# Patient Record
Sex: Female | Born: 1990 | Race: White | Hispanic: No | Marital: Married | Smoking: Never smoker
Health system: Southern US, Community
[De-identification: ages and names within clinical notes are randomized; demographics above are authoritative.]

## PROBLEM LIST (undated history)

## (undated) DIAGNOSIS — T7840XA Allergy, unspecified, initial encounter: Secondary | ICD-10-CM

## (undated) DIAGNOSIS — F419 Anxiety disorder, unspecified: Secondary | ICD-10-CM

## (undated) HISTORY — DX: Allergy, unspecified, initial encounter: T78.40XA

## (undated) HISTORY — DX: Anxiety disorder, unspecified: F41.9

---
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
## 2009-03-28 ENCOUNTER — Inpatient Hospital Stay (HOSPITAL_COMMUNITY): Admission: AD | Admit: 2009-03-28 | Discharge: 2009-03-28 | Payer: Self-pay | Admitting: Obstetrics and Gynecology

## 2009-03-29 ENCOUNTER — Inpatient Hospital Stay (HOSPITAL_COMMUNITY): Admission: AD | Admit: 2009-03-29 | Discharge: 2009-03-31 | Payer: Self-pay | Admitting: Obstetrics and Gynecology

## 2010-05-05 ENCOUNTER — Ambulatory Visit: Payer: Self-pay | Admitting: Nurse Practitioner

## 2010-05-05 ENCOUNTER — Inpatient Hospital Stay (HOSPITAL_COMMUNITY): Admission: AD | Admit: 2010-05-05 | Discharge: 2010-05-06 | Payer: Self-pay | Admitting: Obstetrics and Gynecology

## 2010-05-06 IMAGING — US US OB TRANSVAGINAL MODIFY
1 series · 13 of 28 positions shown · non-contrast
Comparison: None.

CLINICAL DATA: Vaginal bleeding.

OBSTETRIC <14 WK US AND TRANSVAGINAL OB US
TECHNIQUE: Both transabdominal and transvaginal ultrasound
examinations were performed for complete evaluation of the
gestation as well as the maternal uterus, adnexal regions, and
pelvic cul-de-sac.  Transvaginal technique was performed to assess
early pregnancy.

[Series 1: us ob comp less 14 wks · 0.21mm/px · 13 of 42 slices shown]
[im 2/42]
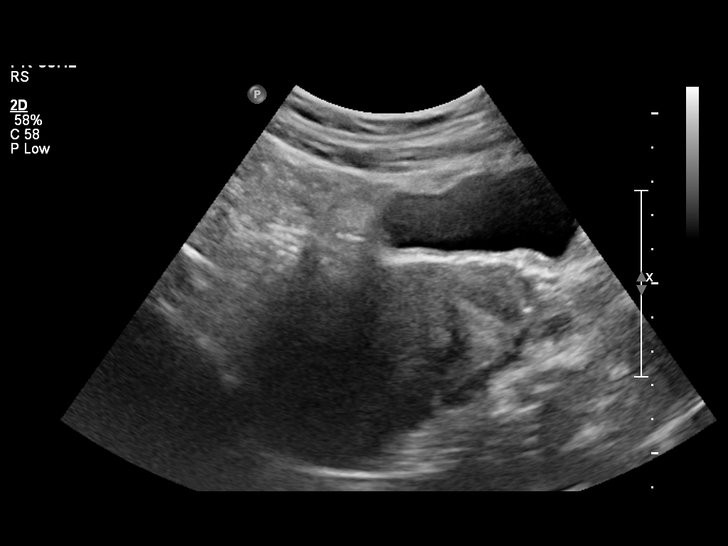
[im 5/42]
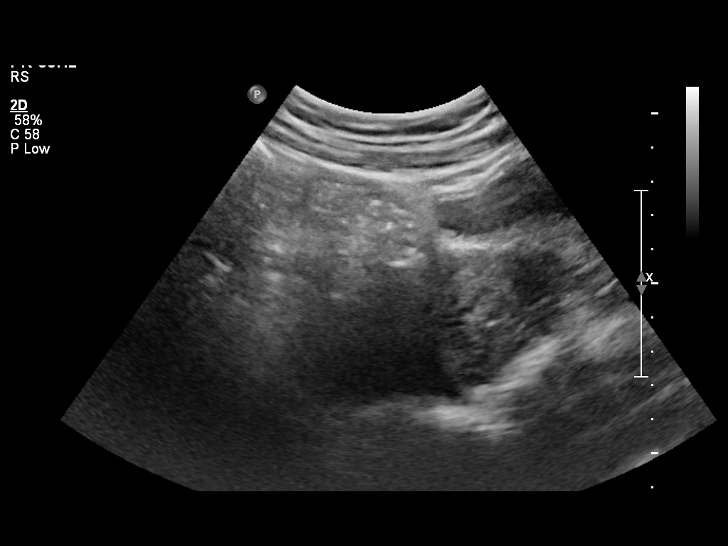
[im 8/42]
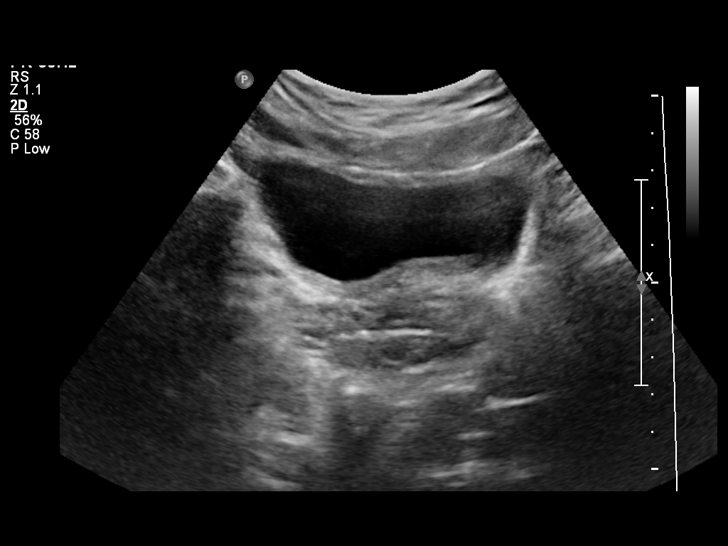
[im 11/42]
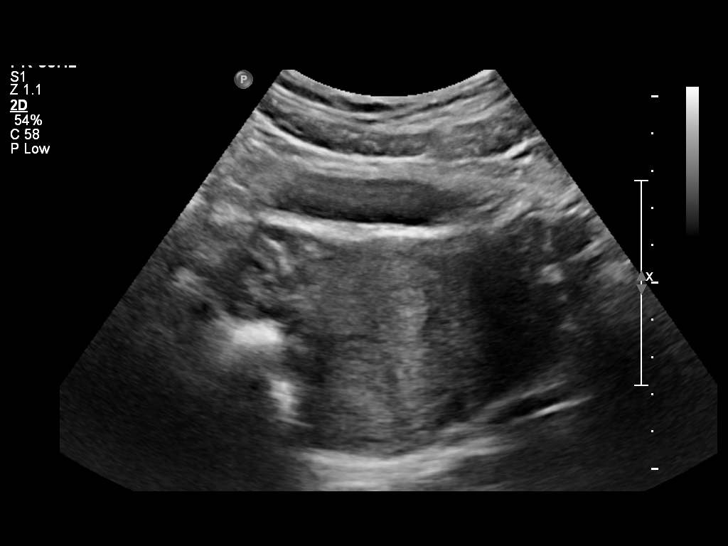
[im 14/42]
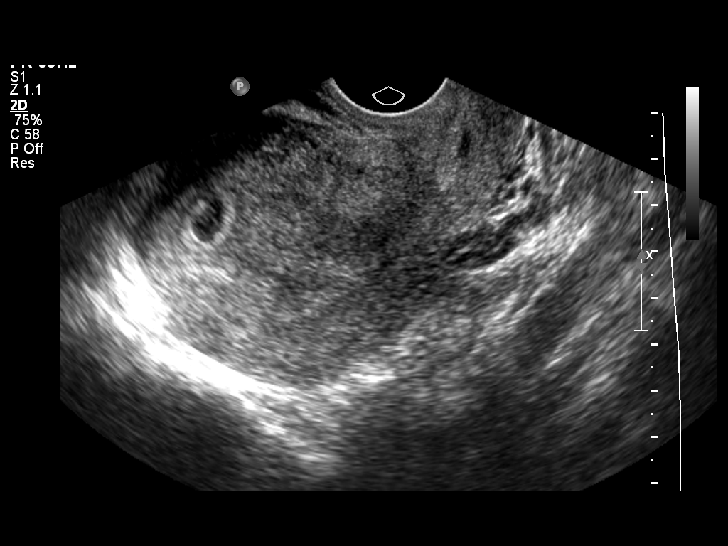
[im 17/42]
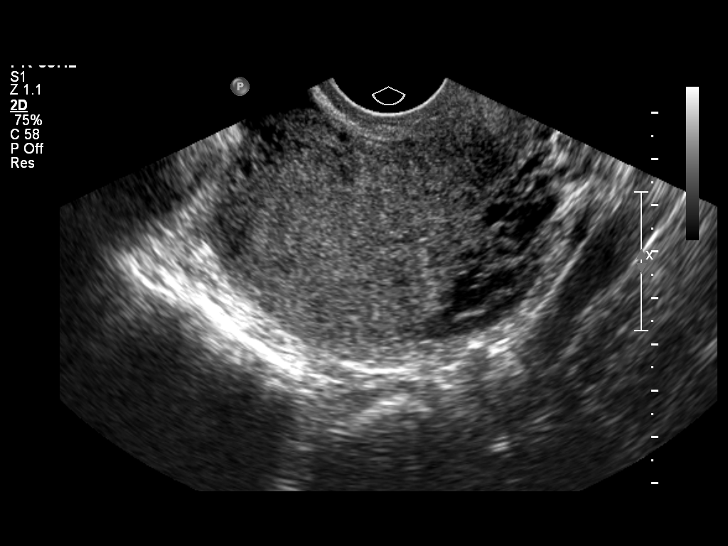
[im 22/42]
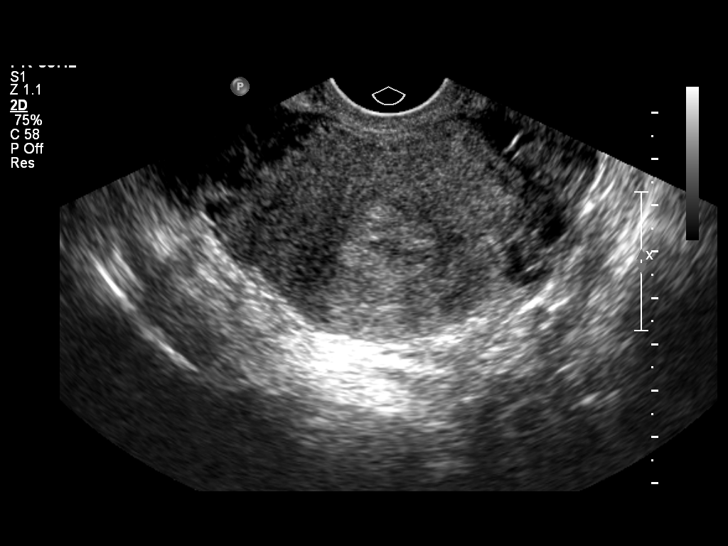
[im 25/42]
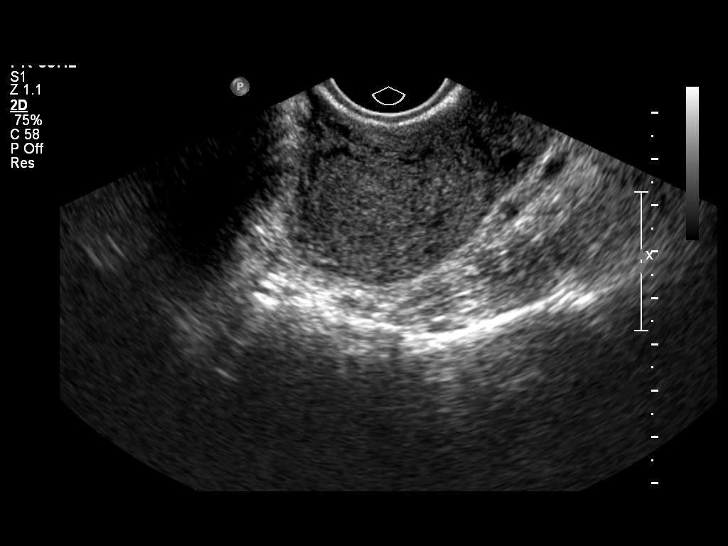
[im 28/42]
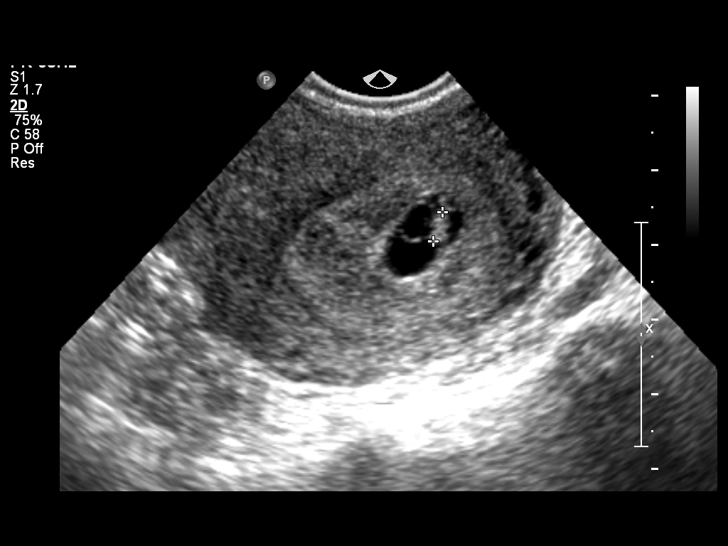
[im 31/42]
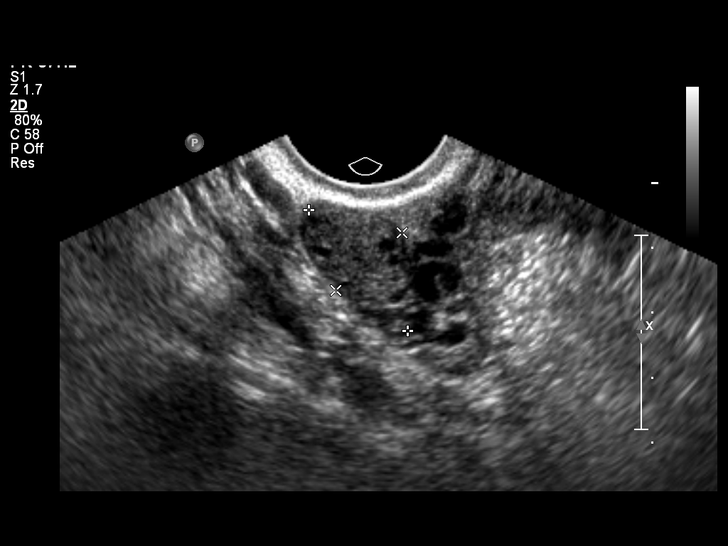
[im 34/42]
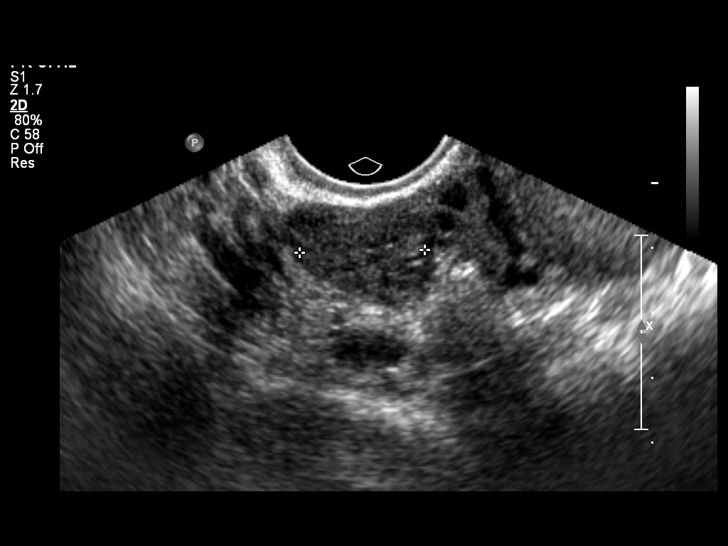
[im 37/42]
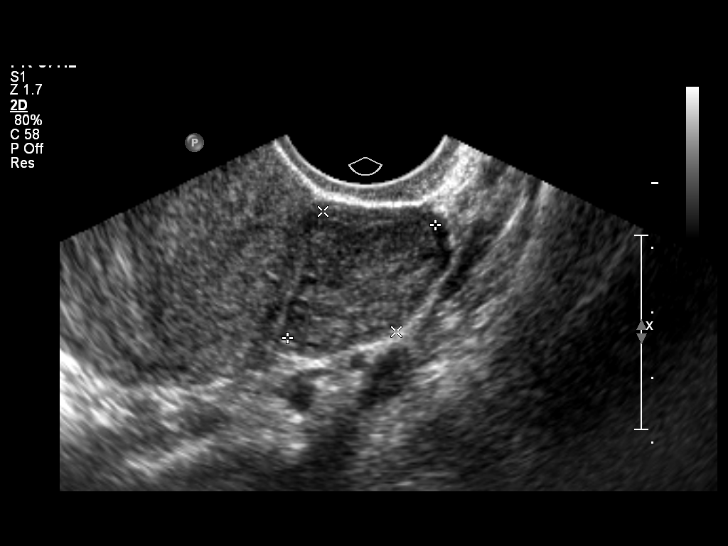
[im 40/42]
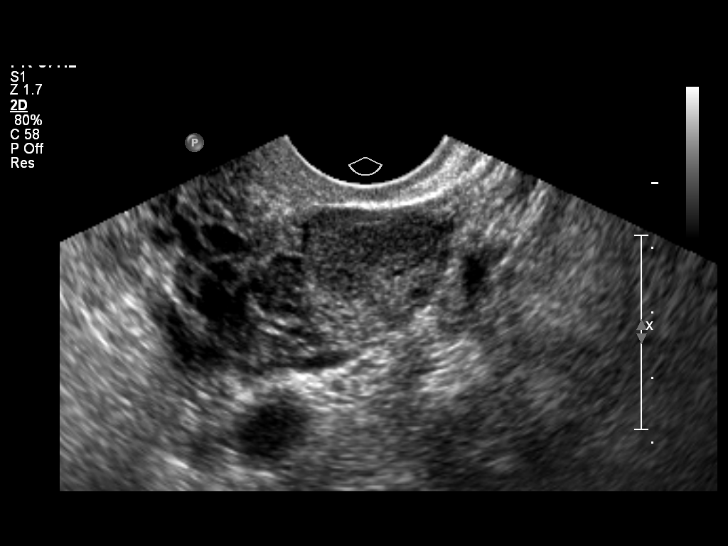

[13 of 28 positions shown; findings below may reference images not displayed]

FINDINGS: A single intrauterine gestational sac is identified.  A yolk sac is
seen.  The embryo has a crown-rump length of 4.2 mm, corresponding
to a gestational age of 6 weeks 1 day.  Cardiac activity is
visualized.  The measured heart rate is 120 beats per minute.

There is no evidence of subchorionic hemorrhage.  The uterus is
unremarkable in appearance.  No fibroids are identified.

The right ovary measures 2.4 x 1.4 x 1.9 cm, while the left ovary
measures 2.9 x 2.2 x 1.8 cm.  No suspicious adnexal masses are
seen.  The ovaries are unremarkable in appearance.  There is no
definite evidence for ovarian torsion.

No free fluid is seen in the pelvic cul-de-sac.
IMPRESSION: Single live intrauterine pregnancy, with a crown-rump length of
mm, corresponding to a gestational age of 6 weeks 1 day.  This does
not match the patient's gestational age by LMP, and reflects a new
estimated date of delivery [DATE].

## 2010-05-07 ENCOUNTER — Inpatient Hospital Stay (HOSPITAL_COMMUNITY): Admission: AD | Admit: 2010-05-07 | Discharge: 2010-05-07 | Payer: Self-pay | Admitting: Obstetrics and Gynecology

## 2010-05-07 ENCOUNTER — Ambulatory Visit: Payer: Self-pay | Admitting: Obstetrics and Gynecology

## 2011-01-17 LAB — CBC
HCT: 37.3 % (ref 36.0–46.0)
Hemoglobin: 13 g/dL (ref 12.0–15.0)
MCH: 31.6 pg (ref 26.0–34.0)
MCHC: 34.9 g/dL (ref 30.0–36.0)
MCV: 90.6 fL (ref 78.0–100.0)
Platelets: 245 10*3/uL (ref 150–400)
RBC: 4.12 MIL/uL (ref 3.87–5.11)
RDW: 13 % (ref 11.5–15.5)
WBC: 7.7 10*3/uL (ref 4.0–10.5)

## 2011-01-17 LAB — HCG, QUANTITATIVE, PREGNANCY: hCG, Beta Chain, Quant, S: 2760 m[IU]/mL — ABNORMAL HIGH (ref <5)

## 2011-01-17 LAB — WET PREP, GENITAL
Trich, Wet Prep: NONE SEEN
Yeast Wet Prep HPF POC: NONE SEEN

## 2011-01-17 LAB — ABO/RH
ABO/RH(D): O NEG
Weak D: NEGATIVE

## 2011-01-17 LAB — GC/CHLAMYDIA PROBE AMP, GENITAL
Chlamydia, DNA Probe: NEGATIVE
GC Probe Amp, Genital: NEGATIVE

## 2011-02-09 LAB — CBC
Hemoglobin: 13.9 g/dL (ref 12.0–15.0)
MCHC: 35.2 g/dL (ref 30.0–36.0)
MCHC: 35.5 g/dL (ref 30.0–36.0)
MCV: 92.5 fL (ref 78.0–100.0)
MCV: 94.2 fL (ref 78.0–100.0)
Platelets: 124 10*3/uL — ABNORMAL LOW (ref 150–400)
Platelets: 168 10*3/uL (ref 150–400)
RBC: 3.15 MIL/uL — ABNORMAL LOW (ref 3.87–5.11)
RDW: 13.2 % (ref 11.5–15.5)
WBC: 9.5 10*3/uL (ref 4.0–10.5)

## 2011-02-09 LAB — RPR: RPR Ser Ql: NONREACTIVE

## 2011-02-09 LAB — RH IMMUNE GLOB WKUP(>/=20WKS)(NOT WOMEN'S HOSP): Fetal Screen: NEGATIVE

## 2011-04-30 ENCOUNTER — Emergency Department (HOSPITAL_COMMUNITY)
Admission: EM | Admit: 2011-04-30 | Discharge: 2011-04-30 | Disposition: A | Discharge status: Home / Self Care | Attending: Emergency Medicine | Admitting: Emergency Medicine

## 2011-04-30 DIAGNOSIS — F411 Generalized anxiety disorder: Secondary | ICD-10-CM | POA: Insufficient documentation

## 2011-04-30 DIAGNOSIS — S40269A Insect bite (nonvenomous) of unspecified shoulder, initial encounter: Secondary | ICD-10-CM | POA: Insufficient documentation

## 2011-04-30 DIAGNOSIS — R21 Rash and other nonspecific skin eruption: Secondary | ICD-10-CM | POA: Insufficient documentation

## 2011-04-30 DIAGNOSIS — W57XXXA Bitten or stung by nonvenomous insect and other nonvenomous arthropods, initial encounter: Secondary | ICD-10-CM | POA: Insufficient documentation

## 2011-07-27 LAB — PREGNANCY, URINE: Preg Test, Ur: NEGATIVE

## 2015-02-26 ENCOUNTER — Ambulatory Visit (INDEPENDENT_AMBULATORY_CARE_PROVIDER_SITE_OTHER): Admitting: Physician Assistant

## 2015-02-26 VITALS — BP 102/64 | HR 97 | Temp 98.5°F | Resp 18 | Ht 64.25 in | Wt 122.4 lb

## 2015-02-26 DIAGNOSIS — J029 Acute pharyngitis, unspecified: Secondary | ICD-10-CM

## 2015-02-26 MED ORDER — AMOXICILLIN 875 MG PO TABS: 875.0000 mg | ORAL_TABLET | Freq: Two times a day (BID) | ORAL | Status: AC

## 2015-02-26 NOTE — Progress Notes (Signed)
   Subjective:    Patient ID: Alyssa Eaton, female    DOB: [birth year], 24 y.o.   MRN: [id number]  HPI  Pt presents to clinic with sore throat that is getting worse over the last couple of days.  She started with what she thought was a cold but then all the cold symptoms went away and the sore throat remained and is worsening.  She is also having myalgias and decreased appetite.  She has a daughter with strep currently.  Home treatment - cold preps Daughter diagnosed with strep 2 days ago  Review of Systems  Constitutional: Positive for fever (low grade), chills and appetite change (decreased).  HENT: Positive for postnasal drip and sore throat.   Respiratory: Positive for cough (from her throat).   Gastrointestinal: Negative for nausea and vomiting.  Musculoskeletal: Positive for myalgias.  Neurological: Negative for headaches.      Objective:   Physical Exam  Constitutional: She is oriented to person, place, and time. She appears well-developed and well-nourished.  BP 102/64 mmHg  Pulse 97  Temp(Src) 98.5 F (36.9 C) (Oral)  Resp 18  Ht 5' 4.25" (1.632 m)  Wt 122 lb 6.4 oz (55.52 kg)  BMI 20.85 kg/m2  SpO2 98%  LMP 02/04/2015   HENT:  Head: Normocephalic and atraumatic.  Right Ear: Hearing, tympanic membrane, external ear and ear canal normal.  Left Ear: Hearing, tympanic membrane, external ear and ear canal normal.  Nose: Nose normal.  Mouth/Throat: Uvula is midline. Oropharyngeal exudate, posterior oropharyngeal edema and posterior oropharyngeal erythema present.  Eyes: Conjunctivae are normal. Pupils are equal, round, and reactive to light.  Neck: Normal range of motion.  Pulmonary/Chest: Effort normal.  Lymphadenopathy:       Head (right side): No tonsillar adenopathy present.       Head (left side): No tonsillar adenopathy present.    She has cervical adenopathy.       Right cervical: Superficial cervical adenopathy present.       Left cervical: Superficial  cervical adenopathy present.       Right: No supraclavicular adenopathy present.       Left: No supraclavicular adenopathy present.  Neurological: She is alert and oriented to person, place, and time.  Skin: Skin is warm and dry.  Psychiatric: She has a normal mood and affect. Her behavior is normal. Judgment and thought content normal.       Assessment & Plan:  Sore throat - Plan: amoxicillin (AMOXIL) 875 MG tablet  With patient's exposure to strep and her physical exam we are going to presumptively treat for strep.  Her questions were answered and symptomatic care was discussed with patient.  Benny LennertSarah Weber PA-C  [facility] 02/26/2015 8:35 PM

## 2016-04-12 DIAGNOSIS — Z6823 Body mass index (BMI) 23.0-23.9, adult: Secondary | ICD-10-CM | POA: Diagnosis not present

## 2016-04-12 DIAGNOSIS — F410 Panic disorder [episodic paroxysmal anxiety] without agoraphobia: Secondary | ICD-10-CM | POA: Diagnosis not present

## 2016-04-19 DIAGNOSIS — J069 Acute upper respiratory infection, unspecified: Secondary | ICD-10-CM | POA: Diagnosis not present

## 2016-07-13 DIAGNOSIS — Z6824 Body mass index (BMI) 24.0-24.9, adult: Secondary | ICD-10-CM | POA: Diagnosis not present

## 2016-07-13 DIAGNOSIS — F410 Panic disorder [episodic paroxysmal anxiety] without agoraphobia: Secondary | ICD-10-CM | POA: Diagnosis not present

## 2016-10-18 DIAGNOSIS — Z2821 Immunization not carried out because of patient refusal: Secondary | ICD-10-CM | POA: Diagnosis not present

## 2016-10-18 DIAGNOSIS — E663 Overweight: Secondary | ICD-10-CM | POA: Diagnosis not present

## 2016-10-18 DIAGNOSIS — F410 Panic disorder [episodic paroxysmal anxiety] without agoraphobia: Secondary | ICD-10-CM | POA: Diagnosis not present

## 2016-10-18 DIAGNOSIS — Z6825 Body mass index (BMI) 25.0-25.9, adult: Secondary | ICD-10-CM | POA: Diagnosis not present

## 2017-01-18 DIAGNOSIS — Z01419 Encounter for gynecological examination (general) (routine) without abnormal findings: Secondary | ICD-10-CM | POA: Diagnosis not present

## 2017-01-18 DIAGNOSIS — Z113 Encounter for screening for infections with a predominantly sexual mode of transmission: Secondary | ICD-10-CM | POA: Diagnosis not present

## 2017-01-18 DIAGNOSIS — Z6825 Body mass index (BMI) 25.0-25.9, adult: Secondary | ICD-10-CM | POA: Diagnosis not present

## 2017-01-31 DIAGNOSIS — N941 Unspecified dyspareunia: Secondary | ICD-10-CM | POA: Diagnosis not present

## 2017-01-31 DIAGNOSIS — R1031 Right lower quadrant pain: Secondary | ICD-10-CM | POA: Diagnosis not present

## 2017-08-26 DIAGNOSIS — N912 Amenorrhea, unspecified: Secondary | ICD-10-CM | POA: Diagnosis not present

## 2017-09-01 DIAGNOSIS — N926 Irregular menstruation, unspecified: Secondary | ICD-10-CM | POA: Diagnosis not present

## 2017-09-05 DIAGNOSIS — Z6827 Body mass index (BMI) 27.0-27.9, adult: Secondary | ICD-10-CM | POA: Diagnosis not present

## 2017-09-05 DIAGNOSIS — F410 Panic disorder [episodic paroxysmal anxiety] without agoraphobia: Secondary | ICD-10-CM | POA: Diagnosis not present

## 2017-09-05 DIAGNOSIS — E663 Overweight: Secondary | ICD-10-CM | POA: Diagnosis not present

## 2018-01-18 DIAGNOSIS — F411 Generalized anxiety disorder: Secondary | ICD-10-CM | POA: Diagnosis not present

## 2018-01-23 DIAGNOSIS — N390 Urinary tract infection, site not specified: Secondary | ICD-10-CM | POA: Diagnosis not present

## 2018-01-23 DIAGNOSIS — Z01419 Encounter for gynecological examination (general) (routine) without abnormal findings: Secondary | ICD-10-CM | POA: Diagnosis not present

## 2018-01-23 DIAGNOSIS — Z6825 Body mass index (BMI) 25.0-25.9, adult: Secondary | ICD-10-CM | POA: Diagnosis not present

## 2018-01-25 DIAGNOSIS — F411 Generalized anxiety disorder: Secondary | ICD-10-CM | POA: Diagnosis not present

## 2018-02-04 DIAGNOSIS — F411 Generalized anxiety disorder: Secondary | ICD-10-CM | POA: Diagnosis not present

## 2018-02-08 DIAGNOSIS — F411 Generalized anxiety disorder: Secondary | ICD-10-CM | POA: Diagnosis not present

## 2018-02-15 DIAGNOSIS — F411 Generalized anxiety disorder: Secondary | ICD-10-CM | POA: Diagnosis not present

## 2018-03-01 DIAGNOSIS — F411 Generalized anxiety disorder: Secondary | ICD-10-CM | POA: Diagnosis not present

## 2018-03-06 DIAGNOSIS — Z6824 Body mass index (BMI) 24.0-24.9, adult: Secondary | ICD-10-CM | POA: Diagnosis not present

## 2018-03-06 DIAGNOSIS — F410 Panic disorder [episodic paroxysmal anxiety] without agoraphobia: Secondary | ICD-10-CM | POA: Diagnosis not present

## 2018-03-10 DIAGNOSIS — F411 Generalized anxiety disorder: Secondary | ICD-10-CM | POA: Diagnosis not present

## 2018-03-15 DIAGNOSIS — F411 Generalized anxiety disorder: Secondary | ICD-10-CM | POA: Diagnosis not present

## 2018-03-24 DIAGNOSIS — F411 Generalized anxiety disorder: Secondary | ICD-10-CM | POA: Diagnosis not present

## 2018-03-29 DIAGNOSIS — F411 Generalized anxiety disorder: Secondary | ICD-10-CM | POA: Diagnosis not present

## 2018-04-03 DIAGNOSIS — F411 Generalized anxiety disorder: Secondary | ICD-10-CM | POA: Diagnosis not present

## 2018-04-10 DIAGNOSIS — F411 Generalized anxiety disorder: Secondary | ICD-10-CM | POA: Diagnosis not present

## 2018-04-17 DIAGNOSIS — F411 Generalized anxiety disorder: Secondary | ICD-10-CM | POA: Diagnosis not present

## 2018-04-24 DIAGNOSIS — F411 Generalized anxiety disorder: Secondary | ICD-10-CM | POA: Diagnosis not present

## 2018-05-01 DIAGNOSIS — F411 Generalized anxiety disorder: Secondary | ICD-10-CM | POA: Diagnosis not present

## 2018-05-09 DIAGNOSIS — F411 Generalized anxiety disorder: Secondary | ICD-10-CM | POA: Diagnosis not present

## 2018-05-16 DIAGNOSIS — F411 Generalized anxiety disorder: Secondary | ICD-10-CM | POA: Diagnosis not present

## 2018-06-02 DIAGNOSIS — F411 Generalized anxiety disorder: Secondary | ICD-10-CM | POA: Diagnosis not present

## 2018-06-07 DIAGNOSIS — F411 Generalized anxiety disorder: Secondary | ICD-10-CM | POA: Diagnosis not present

## 2018-06-23 DIAGNOSIS — F411 Generalized anxiety disorder: Secondary | ICD-10-CM | POA: Diagnosis not present

## 2018-09-08 DIAGNOSIS — Z6827 Body mass index (BMI) 27.0-27.9, adult: Secondary | ICD-10-CM | POA: Diagnosis not present

## 2018-09-08 DIAGNOSIS — F410 Panic disorder [episodic paroxysmal anxiety] without agoraphobia: Secondary | ICD-10-CM | POA: Diagnosis not present

## 2018-09-08 DIAGNOSIS — Z1331 Encounter for screening for depression: Secondary | ICD-10-CM | POA: Diagnosis not present

## 2018-09-19 DIAGNOSIS — M545 Low back pain: Secondary | ICD-10-CM | POA: Diagnosis not present

## 2018-09-19 DIAGNOSIS — M5441 Lumbago with sciatica, right side: Secondary | ICD-10-CM | POA: Diagnosis not present

## 2018-09-21 DIAGNOSIS — M5441 Lumbago with sciatica, right side: Secondary | ICD-10-CM | POA: Diagnosis not present

## 2018-09-21 DIAGNOSIS — M25551 Pain in right hip: Secondary | ICD-10-CM | POA: Diagnosis not present

## 2018-09-21 DIAGNOSIS — M9905 Segmental and somatic dysfunction of pelvic region: Secondary | ICD-10-CM | POA: Diagnosis not present

## 2018-09-21 DIAGNOSIS — M9903 Segmental and somatic dysfunction of lumbar region: Secondary | ICD-10-CM | POA: Diagnosis not present

## 2018-09-25 DIAGNOSIS — M9905 Segmental and somatic dysfunction of pelvic region: Secondary | ICD-10-CM | POA: Diagnosis not present

## 2018-09-25 DIAGNOSIS — M25551 Pain in right hip: Secondary | ICD-10-CM | POA: Diagnosis not present

## 2018-09-25 DIAGNOSIS — M9903 Segmental and somatic dysfunction of lumbar region: Secondary | ICD-10-CM | POA: Diagnosis not present

## 2018-09-25 DIAGNOSIS — M5441 Lumbago with sciatica, right side: Secondary | ICD-10-CM | POA: Diagnosis not present

## 2018-09-27 DIAGNOSIS — M5441 Lumbago with sciatica, right side: Secondary | ICD-10-CM | POA: Diagnosis not present

## 2018-09-27 DIAGNOSIS — M25551 Pain in right hip: Secondary | ICD-10-CM | POA: Diagnosis not present

## 2018-09-27 DIAGNOSIS — M9903 Segmental and somatic dysfunction of lumbar region: Secondary | ICD-10-CM | POA: Diagnosis not present

## 2018-09-27 DIAGNOSIS — M9905 Segmental and somatic dysfunction of pelvic region: Secondary | ICD-10-CM | POA: Diagnosis not present

## 2018-10-02 DIAGNOSIS — M25551 Pain in right hip: Secondary | ICD-10-CM | POA: Diagnosis not present

## 2018-10-02 DIAGNOSIS — M9905 Segmental and somatic dysfunction of pelvic region: Secondary | ICD-10-CM | POA: Diagnosis not present

## 2018-10-02 DIAGNOSIS — M5441 Lumbago with sciatica, right side: Secondary | ICD-10-CM | POA: Diagnosis not present

## 2018-10-02 DIAGNOSIS — M9903 Segmental and somatic dysfunction of lumbar region: Secondary | ICD-10-CM | POA: Diagnosis not present

## 2018-10-03 DIAGNOSIS — M9905 Segmental and somatic dysfunction of pelvic region: Secondary | ICD-10-CM | POA: Diagnosis not present

## 2018-10-03 DIAGNOSIS — M5441 Lumbago with sciatica, right side: Secondary | ICD-10-CM | POA: Diagnosis not present

## 2018-10-03 DIAGNOSIS — M9903 Segmental and somatic dysfunction of lumbar region: Secondary | ICD-10-CM | POA: Diagnosis not present

## 2018-10-03 DIAGNOSIS — M25551 Pain in right hip: Secondary | ICD-10-CM | POA: Diagnosis not present

## 2018-10-05 DIAGNOSIS — M25551 Pain in right hip: Secondary | ICD-10-CM | POA: Diagnosis not present

## 2018-10-05 DIAGNOSIS — M5441 Lumbago with sciatica, right side: Secondary | ICD-10-CM | POA: Diagnosis not present

## 2018-10-05 DIAGNOSIS — M9903 Segmental and somatic dysfunction of lumbar region: Secondary | ICD-10-CM | POA: Diagnosis not present

## 2018-10-05 DIAGNOSIS — M9905 Segmental and somatic dysfunction of pelvic region: Secondary | ICD-10-CM | POA: Diagnosis not present

## 2018-10-10 DIAGNOSIS — M9903 Segmental and somatic dysfunction of lumbar region: Secondary | ICD-10-CM | POA: Diagnosis not present

## 2018-10-10 DIAGNOSIS — M9905 Segmental and somatic dysfunction of pelvic region: Secondary | ICD-10-CM | POA: Diagnosis not present

## 2018-10-10 DIAGNOSIS — M25551 Pain in right hip: Secondary | ICD-10-CM | POA: Diagnosis not present

## 2018-10-10 DIAGNOSIS — M5441 Lumbago with sciatica, right side: Secondary | ICD-10-CM | POA: Diagnosis not present

## 2018-10-11 DIAGNOSIS — M25551 Pain in right hip: Secondary | ICD-10-CM | POA: Diagnosis not present

## 2018-10-11 DIAGNOSIS — M9905 Segmental and somatic dysfunction of pelvic region: Secondary | ICD-10-CM | POA: Diagnosis not present

## 2018-10-11 DIAGNOSIS — M9903 Segmental and somatic dysfunction of lumbar region: Secondary | ICD-10-CM | POA: Diagnosis not present

## 2018-10-11 DIAGNOSIS — M5441 Lumbago with sciatica, right side: Secondary | ICD-10-CM | POA: Diagnosis not present

## 2018-10-16 DIAGNOSIS — M545 Low back pain: Secondary | ICD-10-CM | POA: Diagnosis not present

## 2018-10-16 DIAGNOSIS — M5441 Lumbago with sciatica, right side: Secondary | ICD-10-CM | POA: Diagnosis not present

## 2018-10-16 DIAGNOSIS — M25551 Pain in right hip: Secondary | ICD-10-CM | POA: Diagnosis not present

## 2018-10-16 DIAGNOSIS — M9905 Segmental and somatic dysfunction of pelvic region: Secondary | ICD-10-CM | POA: Diagnosis not present

## 2018-10-16 DIAGNOSIS — M9903 Segmental and somatic dysfunction of lumbar region: Secondary | ICD-10-CM | POA: Diagnosis not present

## 2018-10-31 DIAGNOSIS — M25551 Pain in right hip: Secondary | ICD-10-CM | POA: Diagnosis not present

## 2018-10-31 DIAGNOSIS — M9903 Segmental and somatic dysfunction of lumbar region: Secondary | ICD-10-CM | POA: Diagnosis not present

## 2018-10-31 DIAGNOSIS — M5441 Lumbago with sciatica, right side: Secondary | ICD-10-CM | POA: Diagnosis not present

## 2018-10-31 DIAGNOSIS — M9905 Segmental and somatic dysfunction of pelvic region: Secondary | ICD-10-CM | POA: Diagnosis not present

## 2018-11-13 DIAGNOSIS — M9905 Segmental and somatic dysfunction of pelvic region: Secondary | ICD-10-CM | POA: Diagnosis not present

## 2018-11-13 DIAGNOSIS — M5441 Lumbago with sciatica, right side: Secondary | ICD-10-CM | POA: Diagnosis not present

## 2018-11-13 DIAGNOSIS — M25551 Pain in right hip: Secondary | ICD-10-CM | POA: Diagnosis not present

## 2018-11-13 DIAGNOSIS — M9903 Segmental and somatic dysfunction of lumbar region: Secondary | ICD-10-CM | POA: Diagnosis not present

## 2018-11-15 DIAGNOSIS — M25551 Pain in right hip: Secondary | ICD-10-CM | POA: Diagnosis not present

## 2018-11-15 DIAGNOSIS — M9905 Segmental and somatic dysfunction of pelvic region: Secondary | ICD-10-CM | POA: Diagnosis not present

## 2018-11-15 DIAGNOSIS — M5441 Lumbago with sciatica, right side: Secondary | ICD-10-CM | POA: Diagnosis not present

## 2018-11-15 DIAGNOSIS — M9903 Segmental and somatic dysfunction of lumbar region: Secondary | ICD-10-CM | POA: Diagnosis not present

## 2018-11-28 DIAGNOSIS — M9905 Segmental and somatic dysfunction of pelvic region: Secondary | ICD-10-CM | POA: Diagnosis not present

## 2018-11-28 DIAGNOSIS — M25551 Pain in right hip: Secondary | ICD-10-CM | POA: Diagnosis not present

## 2018-11-28 DIAGNOSIS — M9903 Segmental and somatic dysfunction of lumbar region: Secondary | ICD-10-CM | POA: Diagnosis not present

## 2018-11-28 DIAGNOSIS — M5441 Lumbago with sciatica, right side: Secondary | ICD-10-CM | POA: Diagnosis not present

## 2018-12-06 DIAGNOSIS — M5441 Lumbago with sciatica, right side: Secondary | ICD-10-CM | POA: Diagnosis not present

## 2018-12-06 DIAGNOSIS — M25551 Pain in right hip: Secondary | ICD-10-CM | POA: Diagnosis not present

## 2018-12-06 DIAGNOSIS — M9905 Segmental and somatic dysfunction of pelvic region: Secondary | ICD-10-CM | POA: Diagnosis not present

## 2018-12-06 DIAGNOSIS — M9903 Segmental and somatic dysfunction of lumbar region: Secondary | ICD-10-CM | POA: Diagnosis not present

## 2018-12-12 DIAGNOSIS — J111 Influenza due to unidentified influenza virus with other respiratory manifestations: Secondary | ICD-10-CM | POA: Diagnosis not present

## 2018-12-12 DIAGNOSIS — J029 Acute pharyngitis, unspecified: Secondary | ICD-10-CM | POA: Diagnosis not present

## 2018-12-20 DIAGNOSIS — M9903 Segmental and somatic dysfunction of lumbar region: Secondary | ICD-10-CM | POA: Diagnosis not present

## 2018-12-20 DIAGNOSIS — M9905 Segmental and somatic dysfunction of pelvic region: Secondary | ICD-10-CM | POA: Diagnosis not present

## 2018-12-20 DIAGNOSIS — M25551 Pain in right hip: Secondary | ICD-10-CM | POA: Diagnosis not present

## 2018-12-20 DIAGNOSIS — M5441 Lumbago with sciatica, right side: Secondary | ICD-10-CM | POA: Diagnosis not present

## 2018-12-25 DIAGNOSIS — S52124A Nondisplaced fracture of head of right radius, initial encounter for closed fracture: Secondary | ICD-10-CM | POA: Diagnosis not present

## 2018-12-25 DIAGNOSIS — S52125A Nondisplaced fracture of head of left radius, initial encounter for closed fracture: Secondary | ICD-10-CM | POA: Diagnosis not present

## 2018-12-26 ENCOUNTER — Ambulatory Visit
Admission: RE | Admit: 2018-12-26 | Discharge: 2018-12-26 | Disposition: A | Discharge status: Home / Self Care | Payer: Self-pay | Source: Ambulatory Visit | Attending: Orthopedic Surgery | Admitting: Orthopedic Surgery

## 2018-12-26 ENCOUNTER — Other Ambulatory Visit: Payer: Self-pay | Admitting: Orthopedic Surgery

## 2018-12-26 DIAGNOSIS — S52122A Displaced fracture of head of left radius, initial encounter for closed fracture: Secondary | ICD-10-CM | POA: Diagnosis not present

## 2018-12-26 DIAGNOSIS — T148XXA Other injury of unspecified body region, initial encounter: Secondary | ICD-10-CM

## 2018-12-28 DIAGNOSIS — S52125D Nondisplaced fracture of head of left radius, subsequent encounter for closed fracture with routine healing: Secondary | ICD-10-CM | POA: Diagnosis not present

## 2019-01-02 DIAGNOSIS — S52122D Displaced fracture of head of left radius, subsequent encounter for closed fracture with routine healing: Secondary | ICD-10-CM | POA: Diagnosis not present

## 2019-01-02 DIAGNOSIS — M25622 Stiffness of left elbow, not elsewhere classified: Secondary | ICD-10-CM | POA: Diagnosis not present

## 2019-01-02 DIAGNOSIS — M25522 Pain in left elbow: Secondary | ICD-10-CM | POA: Diagnosis not present

## 2019-01-02 DIAGNOSIS — M6281 Muscle weakness (generalized): Secondary | ICD-10-CM | POA: Diagnosis not present

## 2019-01-02 DIAGNOSIS — N760 Acute vaginitis: Secondary | ICD-10-CM | POA: Diagnosis not present

## 2019-01-04 DIAGNOSIS — M25622 Stiffness of left elbow, not elsewhere classified: Secondary | ICD-10-CM | POA: Diagnosis not present

## 2019-01-04 DIAGNOSIS — M25522 Pain in left elbow: Secondary | ICD-10-CM | POA: Diagnosis not present

## 2019-01-04 DIAGNOSIS — S52122D Displaced fracture of head of left radius, subsequent encounter for closed fracture with routine healing: Secondary | ICD-10-CM | POA: Diagnosis not present

## 2019-01-04 DIAGNOSIS — M6281 Muscle weakness (generalized): Secondary | ICD-10-CM | POA: Diagnosis not present

## 2019-01-08 DIAGNOSIS — M6281 Muscle weakness (generalized): Secondary | ICD-10-CM | POA: Diagnosis not present

## 2019-01-08 DIAGNOSIS — S52122D Displaced fracture of head of left radius, subsequent encounter for closed fracture with routine healing: Secondary | ICD-10-CM | POA: Diagnosis not present

## 2019-01-08 DIAGNOSIS — M25522 Pain in left elbow: Secondary | ICD-10-CM | POA: Diagnosis not present

## 2019-01-08 DIAGNOSIS — M25622 Stiffness of left elbow, not elsewhere classified: Secondary | ICD-10-CM | POA: Diagnosis not present

## 2019-01-11 DIAGNOSIS — M25622 Stiffness of left elbow, not elsewhere classified: Secondary | ICD-10-CM | POA: Diagnosis not present

## 2019-01-11 DIAGNOSIS — M6281 Muscle weakness (generalized): Secondary | ICD-10-CM | POA: Diagnosis not present

## 2019-01-11 DIAGNOSIS — M25522 Pain in left elbow: Secondary | ICD-10-CM | POA: Diagnosis not present

## 2019-01-11 DIAGNOSIS — S52122D Displaced fracture of head of left radius, subsequent encounter for closed fracture with routine healing: Secondary | ICD-10-CM | POA: Diagnosis not present

## 2019-01-18 DIAGNOSIS — S52122D Displaced fracture of head of left radius, subsequent encounter for closed fracture with routine healing: Secondary | ICD-10-CM | POA: Diagnosis not present

## 2019-01-18 DIAGNOSIS — M25522 Pain in left elbow: Secondary | ICD-10-CM | POA: Diagnosis not present

## 2019-01-18 DIAGNOSIS — M25622 Stiffness of left elbow, not elsewhere classified: Secondary | ICD-10-CM | POA: Diagnosis not present

## 2019-01-18 DIAGNOSIS — M6281 Muscle weakness (generalized): Secondary | ICD-10-CM | POA: Diagnosis not present

## 2019-02-07 DIAGNOSIS — Z309 Encounter for contraceptive management, unspecified: Secondary | ICD-10-CM | POA: Diagnosis not present

## 2019-02-07 DIAGNOSIS — Z6826 Body mass index (BMI) 26.0-26.9, adult: Secondary | ICD-10-CM | POA: Diagnosis not present

## 2019-02-07 DIAGNOSIS — Z01419 Encounter for gynecological examination (general) (routine) without abnormal findings: Secondary | ICD-10-CM | POA: Diagnosis not present

## 2019-02-13 DIAGNOSIS — M25522 Pain in left elbow: Secondary | ICD-10-CM | POA: Diagnosis not present

## 2019-03-09 DIAGNOSIS — F410 Panic disorder [episodic paroxysmal anxiety] without agoraphobia: Secondary | ICD-10-CM | POA: Diagnosis not present

## 2019-05-25 DIAGNOSIS — Z03818 Encounter for observation for suspected exposure to other biological agents ruled out: Secondary | ICD-10-CM | POA: Diagnosis not present

## 2019-05-25 DIAGNOSIS — B349 Viral infection, unspecified: Secondary | ICD-10-CM | POA: Diagnosis not present

## 2019-05-28 DIAGNOSIS — B2790 Infectious mononucleosis, unspecified without complication: Secondary | ICD-10-CM | POA: Diagnosis not present

## 2019-11-07 DIAGNOSIS — Z20822 Contact with and (suspected) exposure to covid-19: Secondary | ICD-10-CM | POA: Diagnosis not present

## 2019-11-07 DIAGNOSIS — F410 Panic disorder [episodic paroxysmal anxiety] without agoraphobia: Secondary | ICD-10-CM | POA: Diagnosis not present

## 2019-12-14 DIAGNOSIS — H52223 Regular astigmatism, bilateral: Secondary | ICD-10-CM | POA: Diagnosis not present

## 2019-12-14 DIAGNOSIS — H5213 Myopia, bilateral: Secondary | ICD-10-CM | POA: Diagnosis not present

## 2019-12-14 DIAGNOSIS — H4423 Degenerative myopia, bilateral: Secondary | ICD-10-CM | POA: Diagnosis not present

## 2019-12-14 DIAGNOSIS — H52221 Regular astigmatism, right eye: Secondary | ICD-10-CM | POA: Diagnosis not present

## 2020-02-12 DIAGNOSIS — N390 Urinary tract infection, site not specified: Secondary | ICD-10-CM | POA: Diagnosis not present

## 2020-02-12 DIAGNOSIS — Z01419 Encounter for gynecological examination (general) (routine) without abnormal findings: Secondary | ICD-10-CM | POA: Diagnosis not present

## 2020-02-12 DIAGNOSIS — Z309 Encounter for contraceptive management, unspecified: Secondary | ICD-10-CM | POA: Diagnosis not present

## 2020-02-12 DIAGNOSIS — Z6822 Body mass index (BMI) 22.0-22.9, adult: Secondary | ICD-10-CM | POA: Diagnosis not present

## 2020-07-11 DIAGNOSIS — Z20822 Contact with and (suspected) exposure to covid-19: Secondary | ICD-10-CM | POA: Diagnosis not present

## 2020-07-11 DIAGNOSIS — J069 Acute upper respiratory infection, unspecified: Secondary | ICD-10-CM | POA: Diagnosis not present

## 2020-08-28 DIAGNOSIS — R1013 Epigastric pain: Secondary | ICD-10-CM | POA: Diagnosis not present

## 2020-08-28 DIAGNOSIS — F410 Panic disorder [episodic paroxysmal anxiety] without agoraphobia: Secondary | ICD-10-CM | POA: Diagnosis not present

## 2020-08-28 DIAGNOSIS — Z6824 Body mass index (BMI) 24.0-24.9, adult: Secondary | ICD-10-CM | POA: Diagnosis not present

## 2020-08-28 DIAGNOSIS — Z1331 Encounter for screening for depression: Secondary | ICD-10-CM | POA: Diagnosis not present

## 2020-10-02 DIAGNOSIS — Z6823 Body mass index (BMI) 23.0-23.9, adult: Secondary | ICD-10-CM | POA: Diagnosis not present

## 2020-10-02 DIAGNOSIS — K5909 Other constipation: Secondary | ICD-10-CM | POA: Diagnosis not present

## 2020-10-02 DIAGNOSIS — R5383 Other fatigue: Secondary | ICD-10-CM | POA: Diagnosis not present

## 2020-10-02 DIAGNOSIS — F410 Panic disorder [episodic paroxysmal anxiety] without agoraphobia: Secondary | ICD-10-CM | POA: Diagnosis not present

## 2020-10-06 DIAGNOSIS — R5383 Other fatigue: Secondary | ICD-10-CM | POA: Diagnosis not present

## 2020-11-05 DIAGNOSIS — Z20822 Contact with and (suspected) exposure to covid-19: Secondary | ICD-10-CM | POA: Diagnosis not present

## 2020-11-05 DIAGNOSIS — R6889 Other general symptoms and signs: Secondary | ICD-10-CM | POA: Diagnosis not present

## 2020-12-17 DIAGNOSIS — Z20822 Contact with and (suspected) exposure to covid-19: Secondary | ICD-10-CM | POA: Diagnosis not present

## 2021-05-06 DIAGNOSIS — Z6824 Body mass index (BMI) 24.0-24.9, adult: Secondary | ICD-10-CM | POA: Diagnosis not present

## 2021-05-06 DIAGNOSIS — Z01419 Encounter for gynecological examination (general) (routine) without abnormal findings: Secondary | ICD-10-CM | POA: Diagnosis not present

## 2021-05-06 DIAGNOSIS — Z309 Encounter for contraceptive management, unspecified: Secondary | ICD-10-CM | POA: Diagnosis not present

## 2021-06-23 DIAGNOSIS — Z6824 Body mass index (BMI) 24.0-24.9, adult: Secondary | ICD-10-CM | POA: Diagnosis not present

## 2021-06-23 DIAGNOSIS — F410 Panic disorder [episodic paroxysmal anxiety] without agoraphobia: Secondary | ICD-10-CM | POA: Diagnosis not present

## 2021-07-28 DIAGNOSIS — F418 Other specified anxiety disorders: Secondary | ICD-10-CM | POA: Diagnosis not present

## 2021-07-28 DIAGNOSIS — Z6824 Body mass index (BMI) 24.0-24.9, adult: Secondary | ICD-10-CM | POA: Diagnosis not present

## 2021-07-28 DIAGNOSIS — G4700 Insomnia, unspecified: Secondary | ICD-10-CM | POA: Diagnosis not present

## 2021-07-28 DIAGNOSIS — F410 Panic disorder [episodic paroxysmal anxiety] without agoraphobia: Secondary | ICD-10-CM | POA: Diagnosis not present

## 2021-09-14 DIAGNOSIS — N926 Irregular menstruation, unspecified: Secondary | ICD-10-CM | POA: Diagnosis not present

## 2021-09-14 DIAGNOSIS — G4700 Insomnia, unspecified: Secondary | ICD-10-CM | POA: Diagnosis not present

## 2021-09-14 DIAGNOSIS — F410 Panic disorder [episodic paroxysmal anxiety] without agoraphobia: Secondary | ICD-10-CM | POA: Diagnosis not present

## 2021-09-14 DIAGNOSIS — R1031 Right lower quadrant pain: Secondary | ICD-10-CM | POA: Diagnosis not present

## 2021-11-30 DIAGNOSIS — N939 Abnormal uterine and vaginal bleeding, unspecified: Secondary | ICD-10-CM | POA: Diagnosis not present

## 2021-11-30 DIAGNOSIS — R5383 Other fatigue: Secondary | ICD-10-CM | POA: Diagnosis not present

## 2022-05-19 DIAGNOSIS — Z6825 Body mass index (BMI) 25.0-25.9, adult: Secondary | ICD-10-CM | POA: Diagnosis not present

## 2022-05-19 DIAGNOSIS — Z01419 Encounter for gynecological examination (general) (routine) without abnormal findings: Secondary | ICD-10-CM | POA: Diagnosis not present

## 2022-06-11 DIAGNOSIS — F331 Major depressive disorder, recurrent, moderate: Secondary | ICD-10-CM | POA: Diagnosis not present
